# Patient Record
Sex: Female | Born: 2000 | Race: Black or African American | Hispanic: No | Marital: Single | Smoking: Never smoker
Health system: Southern US, Community
[De-identification: ages and names within clinical notes are randomized; demographics above are authoritative.]

## PROBLEM LIST (undated history)

## (undated) DIAGNOSIS — K08409 Partial loss of teeth, unspecified cause, unspecified class: Secondary | ICD-10-CM

## (undated) DIAGNOSIS — Z789 Other specified health status: Secondary | ICD-10-CM

## (undated) DIAGNOSIS — J302 Other seasonal allergic rhinitis: Secondary | ICD-10-CM

## (undated) HISTORY — DX: Other specified health status: Z78.9

## (undated) HISTORY — DX: Other seasonal allergic rhinitis: J30.2

## (undated) HISTORY — PX: NO PAST SURGERIES: SHX2092

---
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
## 2022-01-07 ENCOUNTER — Telehealth: Payer: Medicaid Other | Admitting: Family

## 2022-01-07 ENCOUNTER — Encounter: Payer: Self-pay | Admitting: Family

## 2022-01-07 ENCOUNTER — Telehealth (INDEPENDENT_AMBULATORY_CARE_PROVIDER_SITE_OTHER): Payer: Medicaid Other | Admitting: Family

## 2022-01-07 ENCOUNTER — Other Ambulatory Visit: Payer: Self-pay

## 2022-01-07 DIAGNOSIS — Z975 Presence of (intrauterine) contraceptive device: Secondary | ICD-10-CM

## 2022-01-07 DIAGNOSIS — Z3009 Encounter for other general counseling and advice on contraception: Secondary | ICD-10-CM | POA: Diagnosis not present

## 2022-01-07 DIAGNOSIS — N921 Excessive and frequent menstruation with irregular cycle: Secondary | ICD-10-CM | POA: Diagnosis not present

## 2022-01-07 MED ORDER — CYCLOBENZAPRINE HCL 10 MG PO TABS: ORAL_TABLET | ORAL | 0 refills | Status: DC

## 2022-01-07 NOTE — Progress Notes (Signed)
THIS RECORD MAY CONTAIN CONFIDENTIAL INFORMATION THAT SHOULD NOT BE RELEASED WITHOUT REVIEW OF THE SERVICE PROVIDER.  Virtual Follow-Up Visit via Video Note  I connected with Melanie Carlson   on 01/07/22 at 11:00 AM EST by a video enabled telemedicine application and verified that I am speaking with the correct person using two identifiers.   Patient/parent location: home Provider location: in clinic   I discussed the limitations of evaluation and management by telemedicine and the availability of in person appointments.  I discussed that the purpose of this telehealth visit is to provide medical care while limiting exposure to the novel coronavirus.  The patient expressed understanding and agreed to proceed.   Melanie Carlson is a 21 y.o. female referred by Melanie Deutscher, MD here today for follow-up of breakthrough bleeding with Nexplanon.   History was provided by the patient.  Supervising Physician: Melanie Carlson   Chief Complaint: Breakthrough bleeding with Nexplanon   History of Present Illness:  -had Nexplanon inserted day after delivery  -bled for 6 weeks as expected with pospartum  -will now bleed for 2 months, then will sporadically be off  -always formula fed  -knows that there is hormonal  -LMP 01/19 -concern  No Known Allergies Outpatient Medications Prior to Visit  Medication Sig Dispense Refill   cetirizine (ZYRTEC) 10 MG tablet Take 10 mg by mouth daily.     Fluocinolone Acetonide 0.01 % OIL Instill 5 drops twice daily into both ear(s) for 14 days. Then stop and use as needed for itching or discomfort (Patient not taking: Reported on 05/04/2021)     fluticasone (FLONASE) 50 MCG/ACT nasal spray Place 1 spray into both nostrils 2 (two) times daily. 16 g 2   mirtazapine (REMERON) 15 MG tablet TAKE 1 TABLET BY MOUTH EVERYDAY AT BEDTIME 90 tablet 1   Prenatal Vit-Fe Fumarate-FA (M-NATAL PLUS) 27-1 MG TABS Take 1 tablet by mouth daily. (Patient not taking: No sig  reported)     No facility-administered medications prior to visit.     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  Visual Observations/Objective:  General Appearance: Well nourished well developed, in no apparent distress.  Eyes: conjunctiva no swelling or erythema ENT/Mouth: No hoarseness, No cough for duration of visit.  Neck: Supple  Respiratory: Respiratory effort normal, normal rate, no retractions or distress.   Cardio: Appears well-perfused, noncyanotic Musculoskeletal: no obvious deformity Skin: visible skin without rashes, ecchymosis, erythema Neuro: Awake and oriented X 3,  Psych:  normal affect, Insight and Judgment appropriate.    Assessment/Plan:  Reviewed typical side effects of nexplanon; Melanie Carlson's bleeding pattern is consistent with the implant's most common side effect. Negative gc/c on 12/05. We reviewed the IUD insertion, we discussed the risks and benefits including bleeding, cramping, expulsion, and perforation. [name] elects to return to clinic for insertion.   1. Breakthrough bleeding on Nexplanon 2. Counseling for birth control regarding intrauterine device (IUD)  I discussed the assessment and treatment plan with the patient and/or parent/guardian.  They were provided an opportunity to ask  questions and all were answered.  They agreed with the plan and demonstrated an understanding of the instructions. They were advised to call back or seek an in-person  evaluation in the emergency room if the symptoms worsen or if the condition fails to improve as anticipated.   Follow-up:   Schedule for Nexplanon removal and IUD insertion  Melanie Mouse, NP    CC: Melanie Deutscher, MD, Melanie Deutscher, MD

## 2022-01-08 ENCOUNTER — Ambulatory Visit: Payer: Medicaid Other | Admitting: Family

## 2022-01-08 ENCOUNTER — Encounter: Payer: Self-pay | Admitting: Family

## 2022-01-12 ENCOUNTER — Encounter: Payer: Self-pay | Admitting: Family

## 2022-01-12 ENCOUNTER — Other Ambulatory Visit: Payer: Self-pay

## 2022-01-12 ENCOUNTER — Ambulatory Visit (INDEPENDENT_AMBULATORY_CARE_PROVIDER_SITE_OTHER): Payer: Medicaid Other | Admitting: Family

## 2022-01-12 VITALS — BP 112/69 | HR 74 | Ht 63.0 in | Wt 126.0 lb

## 2022-01-12 DIAGNOSIS — N921 Excessive and frequent menstruation with irregular cycle: Secondary | ICD-10-CM | POA: Diagnosis not present

## 2022-01-12 DIAGNOSIS — Z975 Presence of (intrauterine) contraceptive device: Secondary | ICD-10-CM

## 2022-01-12 DIAGNOSIS — Z3046 Encounter for surveillance of implantable subdermal contraceptive: Secondary | ICD-10-CM

## 2022-01-12 DIAGNOSIS — Z538 Procedure and treatment not carried out for other reasons: Secondary | ICD-10-CM

## 2022-01-12 MED ORDER — ELLA 30 MG PO TABS: 1.0000 | ORAL_TABLET | Freq: Once | ORAL | 0 refills | Status: AC

## 2022-01-12 NOTE — Patient Instructions (Signed)
I sent Emergency Contraception to your pharmacy. Make sure to let me know if you are not able to get an appointment with OB/GYN. I can send a referral for you to see one of our OB/GYN providers.   Your Nexplanon was removed today and is no longer preventing pregnancy.  If you have sex, remember to use condoms to prevent pregnancy and to prevent sexually transmitted infections.  Leave the outside bandage on for 24 hours.  Leave the smaller bandages on for 3-5 days or until they fall off on their own.  Keep the area clean and dry for 3-5 days.  There is usually bruising or swelling at and around the removal site for a few days to a week after the removal.  If you see redness or pus draining from the removal site, call us immediately.  We would like you to return to the clinic for a follow-up visit in 1 month.  You can call [facility] 24 hours a day with any questions or concerns.  There is always a nurse or doctor available to take your call.  Call 9-1-1 if you have a life-threatening emergency.  For anything else, please call us at [phone] before heading to the ER.

## 2022-01-13 ENCOUNTER — Encounter: Payer: Self-pay | Admitting: Family

## 2022-01-13 NOTE — Progress Notes (Signed)
History was provided by the patient.  Melanie Carlson is a 21 y.o. female who is here for Nexplanon removal and IUD insertion.   PCP confirmed? Yes.    [name], MD  HPI:   -presents for Nexplanon removal due to persistent BTB, gc/c negative 12/05 - has been consistently unpredictable since inserted postpartum 01/16/21.  -has been counseled on IUD and desires insertion  -reviewed bleeding, cramping, expulsion, and perforation as risks of procedure; patient aware and consents to proceed.   Patient Active Problem List   Diagnosis Date Noted   Diastasis recti 02/17/2021   Postpartum anxiety 02/03/2021   GAD (generalized anxiety disorder) 04/24/2020   Sleep disturbance 04/24/2020   Major depressive disorder 10/01/2019   Seasonal allergies    Overweight, pediatric, BMI 85.0-94.9 percentile for age 10/30/2018    Current Outpatient Medications on File Prior to Visit  Medication Sig Dispense Refill   cetirizine (ZYRTEC) 10 MG tablet Take 10 mg by mouth daily.     cyclobenzaprine (FLEXERIL) 10 MG tablet Take 1 tablet (10 mg) approximately 4 hours before your scheduled appointment. 2 tablet 0   mirtazapine (REMERON) 15 MG tablet TAKE 1 TABLET BY MOUTH EVERYDAY AT BEDTIME 90 tablet 1   Fluocinolone Acetonide 0.01 % OIL Instill 5 drops twice daily into both ear(s) for 14 days. Then stop and use as needed for itching or discomfort (Patient not taking: Reported on 05/04/2021)     fluticasone (FLONASE) 50 MCG/ACT nasal spray Place 1 spray into both nostrils 2 (two) times daily. 16 g 2   Prenatal Vit-Fe Fumarate-FA (M-NATAL PLUS) 27-1 MG TABS Take 1 tablet by mouth daily. (Patient not taking: No sig reported)     No current facility-administered medications on file prior to visit.    No Known Allergies  Physical Exam:    Vitals:   01/12/22 1020  BP: 112/69  Pulse: 74  Weight: 126 lb (57.2 kg)  Height: 5\' 3"  (1.6 m)    Blood pressure percentiles are not available for patients who  are 18 years or older. No LMP recorded.  Physical Exam Exam conducted with a chaperone present.  Constitutional:      General: She is not in acute distress.    Appearance: She is well-developed.  HENT:     Head: Normocephalic and atraumatic.  Eyes:     General: No scleral icterus.    Pupils: Pupils are equal, round, and reactive to light.  Neck:     Thyroid: No thyromegaly.  Cardiovascular:     Rate and Rhythm: Normal rate.     Heart sounds: No murmur heard. Pulmonary:     Effort: Pulmonary effort is normal.     Breath sounds: Normal breath sounds.  Genitourinary:    General: Normal vulva.     Vagina: Normal.     Cervix: No cervical motion tenderness or friability.  Musculoskeletal:        General: Normal range of motion.     Cervical back: Normal range of motion and neck supple.  Lymphadenopathy:     Cervical: No cervical adenopathy.  Skin:    General: Skin is warm and dry.     Findings: No rash.     Comments: Implant palpable in LUE correct position prior to removal   Neurological:     Mental Status: She is alert and oriented to person, place, and time.     Cranial Nerves: No cranial nerve deficit.  Psychiatric:        Behavior:  Behavior normal.        Thought Content: Thought content normal.        Judgment: Judgment normal.     Assessment/Plan: 1. Breakthrough bleeding on Nexplanon 2. Encounter for Nexplanon removal Risks & benefits of Nexplanon removal discussed. Consent form signed.  The patient denies any allergies to anesthetics or antiseptics.  Procedure: Pt was placed in supine position. left arm was flexed at the elbow and externally rotated so that her wrist was parallel to her ear, The device was palpated and marked. The site was cleaned with Betadine. The area surrounding the device was covered with a sterile drape. 1% lidocaine was injected just under the device. A scalpel was used to create a small incision. The device was pushed towards the  incision. Fibrous tissue surrounding the device was gradually removed from the device. The device was removed and measured to ensure all 4 cm of device was removed. Steri-strips were used to close the incision. Pressure dressing was applied to the patient.  The patient was instructed to removed the pressure dressing in 24 hrs.  The patient was advised to move slowly from a supine to an upright position  The patient denied any concerns or complaints  The patient was instructed to schedule a follow-up appt in 1 month. The patient will be called in 1 week to address any concerns.    3. Unsuccessful IUD insertion Mirena IUD Insertion   The pt presents for Mirena IUD placement.  No contraindications for placement.   The patient took Flexeril 10 mg  prior to appt.   No LMP recorded.  UHCG: NA removed today  Last unprotected sex:  NA, nexplanon removed today   Risks & benefits of IUD discussed  The IUD was purchased and supplied by [facility].  Packaging instructions supplied to patient  Consent form signed.  The patient denies any allergies to anesthetics or antiseptics.   Procedure:  Pt was placed in lithotomy position.  Speculum was inserted.  GC/CT swab was used to collect sample for STI testing.  Tenaculum was used to stabilize the cervix by clasping at 12 o'clock  Betadine was used to clean the cervix and cervical os.  Dilators were used. Cervix was not dilated.  Speculum was removed.  The patient was advised to move slowly from a supine to an upright position  The patient denied any concerns or complaints   Plan to return to OB/GYN in [facility] for IUD; discussed EC - Rx given

## 2022-07-02 ENCOUNTER — Ambulatory Visit (INDEPENDENT_AMBULATORY_CARE_PROVIDER_SITE_OTHER): Payer: Medicaid Other | Admitting: Licensed Clinical Social Worker

## 2022-07-02 ENCOUNTER — Encounter: Payer: Self-pay | Admitting: Pediatrics

## 2022-07-02 ENCOUNTER — Ambulatory Visit (INDEPENDENT_AMBULATORY_CARE_PROVIDER_SITE_OTHER): Payer: Medicaid Other | Admitting: Pediatrics

## 2022-07-02 VITALS — BP 108/66 | HR 60 | Wt 122.0 lb

## 2022-07-02 DIAGNOSIS — Z6820 Body mass index (BMI) 20.0-20.9, adult: Secondary | ICD-10-CM | POA: Diagnosis not present

## 2022-07-02 DIAGNOSIS — R634 Abnormal weight loss: Secondary | ICD-10-CM | POA: Diagnosis not present

## 2022-07-02 DIAGNOSIS — Z1339 Encounter for screening examination for other mental health and behavioral disorders: Secondary | ICD-10-CM

## 2022-07-02 DIAGNOSIS — Z0001 Encounter for general adult medical examination with abnormal findings: Secondary | ICD-10-CM

## 2022-07-02 DIAGNOSIS — F4329 Adjustment disorder with other symptoms: Secondary | ICD-10-CM | POA: Diagnosis not present

## 2022-07-02 DIAGNOSIS — L709 Acne, unspecified: Secondary | ICD-10-CM | POA: Diagnosis not present

## 2022-07-02 DIAGNOSIS — Z1331 Encounter for screening for depression: Secondary | ICD-10-CM

## 2022-07-02 MED ORDER — CLINDAMYCIN PHOS-BENZOYL PEROX 1.2-5 % EX GEL: 1.0000 | Freq: Every day | CUTANEOUS | 1 refills | Status: DC

## 2022-07-02 MED ORDER — TRETINOIN MICROSPHERE 0.04 % EX GEL: Freq: Every day | CUTANEOUS | 1 refills | Status: DC
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
## 2022-07-16 ENCOUNTER — Ambulatory Visit: Payer: Medicaid Other | Admitting: Licensed Clinical Social Worker

## 2022-07-26 ENCOUNTER — Encounter: Payer: Self-pay | Admitting: Pediatrics

## 2022-08-31 ENCOUNTER — Other Ambulatory Visit: Payer: Self-pay | Admitting: Pediatrics

## 2022-08-31 DIAGNOSIS — L709 Acne, unspecified: Secondary | ICD-10-CM

## 2022-09-07 ENCOUNTER — Ambulatory Visit
Admission: RE | Admit: 2022-09-07 | Discharge: 2022-09-07 | Disposition: A | Discharge status: Home / Self Care | Payer: Medicaid Other | Source: Ambulatory Visit | Attending: Urgent Care | Admitting: Urgent Care

## 2022-09-07 VITALS — BP 120/69 | HR 76 | Temp 98.1°F | Resp 17

## 2022-09-07 DIAGNOSIS — B3731 Acute candidiasis of vulva and vagina: Secondary | ICD-10-CM | POA: Diagnosis not present

## 2022-09-07 MED ORDER — FLUCONAZOLE 150 MG PO TABS: 150.0000 mg | ORAL_TABLET | Freq: Once | ORAL | 0 refills | Status: AC

## 2022-09-07 NOTE — ED Provider Notes (Signed)
[facility]  Note:  This document was prepared using Systems analyst and may include unintentional dictation errors.  MRN: [id number] DOB: 2001/07/22  Subjective:   Melanie Carlson is a 21 y.o. female presenting for 1 day history of vaginal itching and irritation. She took Monistat and helped. However, she would like to be checked for reassurance. Would like an STI check as well. Denies fever, n/v, abdominal pain, pelvic pain, rashes, dysuria, urinary frequency, hematuria, vaginal discharge.  No concerns for pregnancy at this time. LMP was 08/16/2022.  No current facility-administered medications for this encounter.  Current Outpatient Medications:    cetirizine (ZYRTEC) 10 MG tablet, Take 10 mg by mouth daily., Disp: , Rfl:    Clindamycin-Benzoyl Per, Refr, gel, APPLY 1 APPLICATION TOPICALLY DAILY. FOLLOW WITH NON-COMEDOGENIC MOISTURIZER, Disp: 45 g, Rfl: 1   cyclobenzaprine (FLEXERIL) 10 MG tablet, Take 1 tablet (10 mg) approximately 4 hours before your scheduled appointment. (Patient not taking: Reported on 07/02/2022), Disp: 2 tablet, Rfl: 0   fluticasone (FLONASE) 50 MCG/ACT nasal spray, Place 1 spray into both nostrils 2 (two) times daily., Disp: 16 g, Rfl: 2   mirtazapine (REMERON) 15 MG tablet, TAKE 1 TABLET BY MOUTH EVERYDAY AT BEDTIME, Disp: 90 tablet, Rfl: 1   tretinoin microspheres (RETIN-A MICRO) 0.04 % gel, Apply topically at bedtime. Apply at bedtime - start with 2x/week and gradually increase to daily as tolerated. SPF daily., Disp: 45 g, Rfl: 1   No Known Allergies  Past Medical History:  Diagnosis Date   Medical history non-contributory    Seasonal allergies    Seasonal allergies    Wisdom teeth extracted      Past Surgical History:  Procedure Laterality Date   NO PAST SURGERIES      No family history on file.  Social History   Tobacco Use   Smoking status: Never    Passive exposure: Never   Smokeless tobacco: Never   Vaping Use   Vaping Use: Never used  Substance Use Topics   Alcohol use: Not Currently   Drug use: Yes    Types: Marijuana    ROS   Objective:   Vitals: BP 120/69   Pulse 76   Temp 98.1 F (36.7 C)   Resp 17   LMP 08/16/2022   SpO2 97%   Physical Exam Constitutional:      General: She is not in acute distress.    Appearance: Normal appearance. She is well-developed. She is not ill-appearing, toxic-appearing or diaphoretic.  HENT:     Head: Normocephalic and atraumatic.     Nose: Nose normal.     Mouth/Throat:     Mouth: Mucous membranes are moist.  Eyes:     General: No scleral icterus.       Right eye: No discharge.        Left eye: No discharge.     Extraocular Movements: Extraocular movements intact.  Cardiovascular:     Rate and Rhythm: Normal rate.  Pulmonary:     Effort: Pulmonary effort is normal.  Skin:    General: Skin is warm and dry.  Neurological:     General: No focal deficit present.     Mental Status: She is alert and oriented to person, place, and time.  Psychiatric:        Mood and Affect: Mood normal.        Behavior: Behavior normal.     Assessment and Plan :   PDMP  not reviewed this encounter.  1. Yeast vaginitis    Will cover patient with 1 dose of fluconazole. Treat as appropriate based off her vaginal swab results otherwise. Counseled patient on potential for adverse effects with medications prescribed/recommended today, ER and return-to-clinic precautions discussed, patient verbalized understanding.    Wallis Bamberg, [place] 09/07/22 [phone]

## 2022-09-07 NOTE — Discharge Instructions (Addendum)
Will let you know about your test results and if you need further treatment.

## 2022-09-07 NOTE — ED Triage Notes (Signed)
Pt had symptoms of a yeast infection and took one day monistat. She is not having symptoms anymore but would like a test done to make sure all is cleared up.

## 2022-09-08 LAB — CERVICOVAGINAL ANCILLARY ONLY
Bacterial Vaginitis (gardnerella): POSITIVE — AB
Candida Glabrata: NEGATIVE
Candida Vaginitis: NEGATIVE
Chlamydia: NEGATIVE
Comment: NEGATIVE
Comment: NEGATIVE
Comment: NEGATIVE
Comment: NEGATIVE
Comment: NEGATIVE
Comment: NORMAL
Neisseria Gonorrhea: NEGATIVE
Trichomonas: NEGATIVE

## 2022-09-09 ENCOUNTER — Telehealth (HOSPITAL_COMMUNITY): Payer: Self-pay | Admitting: Emergency Medicine

## 2022-09-09 MED ORDER — METRONIDAZOLE 500 MG PO TABS: 500.0000 mg | ORAL_TABLET | Freq: Two times a day (BID) | ORAL | 0 refills | Status: DC

## 2022-09-22 ENCOUNTER — Ambulatory Visit: Payer: Medicaid Other | Admitting: Family Medicine

## 2022-09-25 ENCOUNTER — Ambulatory Visit: Payer: Medicaid Other

## 2022-10-04 ENCOUNTER — Ambulatory Visit: Payer: Medicaid Other

## 2022-10-05 ENCOUNTER — Ambulatory Visit
Admission: RE | Admit: 2022-10-05 | Discharge: 2022-10-05 | Disposition: A | Discharge status: Home / Self Care | Payer: Medicaid Other | Source: Ambulatory Visit | Attending: Urgent Care | Admitting: Urgent Care

## 2022-10-05 VITALS — BP 112/66 | HR 77 | Temp 98.4°F | Resp 16

## 2022-10-05 DIAGNOSIS — Z113 Encounter for screening for infections with a predominantly sexual mode of transmission: Secondary | ICD-10-CM | POA: Diagnosis not present

## 2022-10-05 NOTE — ED Triage Notes (Signed)
Patient presents to UC for STD testing. Asymptomatic.  

## 2022-10-05 NOTE — ED Provider Notes (Signed)
[facility]  Note:  This document was prepared using Conservation officer, historic buildings and may include unintentional dictation errors.  MRN: [id number] DOB: 07-27-2001  Subjective:   Melanie Carlson is a 21 y.o. female presenting for STI testing.  Patient had sex with a new partner, uses condoms.  Would like to have blood work done as well. Denies fever, n/v, abdominal pain, pelvic pain, rashes, dysuria, urinary frequency, hematuria, vaginal discharge.    No current facility-administered medications for this encounter.  Current Outpatient Medications:    cetirizine (ZYRTEC) 10 MG tablet, Take 10 mg by mouth daily., Disp: , Rfl:    Clindamycin-Benzoyl Per, Refr, gel, APPLY 1 APPLICATION TOPICALLY DAILY. FOLLOW WITH NON-COMEDOGENIC MOISTURIZER, Disp: 45 g, Rfl: 1   cyclobenzaprine (FLEXERIL) 10 MG tablet, Take 1 tablet (10 mg) approximately 4 hours before your scheduled appointment. (Patient not taking: Reported on 07/02/2022), Disp: 2 tablet, Rfl: 0   fluticasone (FLONASE) 50 MCG/ACT nasal spray, Place 1 spray into both nostrils 2 (two) times daily., Disp: 16 g, Rfl: 2   metroNIDAZOLE (FLAGYL) 500 MG tablet, Take 1 tablet (500 mg total) by mouth 2 (two) times daily., Disp: 14 tablet, Rfl: 0   mirtazapine (REMERON) 15 MG tablet, TAKE 1 TABLET BY MOUTH EVERYDAY AT BEDTIME, Disp: 90 tablet, Rfl: 1   tretinoin microspheres (RETIN-A MICRO) 0.04 % gel, Apply topically at bedtime. Apply at bedtime - start with 2x/week and gradually increase to daily as tolerated. SPF daily., Disp: 45 g, Rfl: 1   No Known Allergies  Past Medical History:  Diagnosis Date   Medical history non-contributory    Seasonal allergies    Seasonal allergies    Wisdom teeth extracted      Past Surgical History:  Procedure Laterality Date   NO PAST SURGERIES      Family History  Problem Relation Age of Onset   Healthy Mother    Healthy Father     Social History   Tobacco Use   Smoking  status: Never    Passive exposure: Never   Smokeless tobacco: Never  Vaping Use   Vaping Use: Never used  Substance Use Topics   Alcohol use: Not Currently   Drug use: Yes    Types: Marijuana    ROS   Objective:   Vitals: BP 112/66 (BP Location: Left Arm)   Pulse 77   Temp 98.4 F (36.9 C) (Oral)   Resp 16   LMP 09/17/2022 (Approximate)   SpO2 97%   Breastfeeding No   Physical Exam Constitutional:      General: She is not in acute distress.    Appearance: Normal appearance. She is well-developed. She is not ill-appearing, toxic-appearing or diaphoretic.  HENT:     Head: Normocephalic and atraumatic.     Nose: Nose normal.     Mouth/Throat:     Mouth: Mucous membranes are moist.  Eyes:     General: No scleral icterus.       Right eye: No discharge.        Left eye: No discharge.     Extraocular Movements: Extraocular movements intact.  Cardiovascular:     Rate and Rhythm: Normal rate.  Pulmonary:     Effort: Pulmonary effort is normal.  Skin:    General: Skin is warm and dry.  Neurological:     General: No focal deficit present.     Mental Status: She is alert and oriented to person, place, and time.  Psychiatric:  Mood and Affect: Mood normal.        Behavior: Behavior normal.      Assessment and Plan :   PDMP not reviewed this encounter.  1. Screen for STD (sexually transmitted disease)     Deferred testing for bacterial vaginosis and yeast infection given lack of symptoms.  STI testing pending.  We will treat as appropriate.  Patient declined AVS as she has MyChart.   Jaynee Eagles, PA-C 10/05/22 1001

## 2022-10-06 LAB — CERVICOVAGINAL ANCILLARY ONLY
Chlamydia: NEGATIVE
Comment: NEGATIVE
Comment: NEGATIVE
Comment: NORMAL
Neisseria Gonorrhea: NEGATIVE
Trichomonas: NEGATIVE

## 2022-10-06 LAB — RPR: RPR Ser Ql: NONREACTIVE

## 2022-10-06 LAB — HIV ANTIBODY (ROUTINE TESTING W REFLEX): HIV Screen 4th Generation wRfx: NONREACTIVE

## 2022-10-13 ENCOUNTER — Ambulatory Visit: Payer: Medicaid Other | Admitting: Family Medicine

## 2022-10-18 ENCOUNTER — Ambulatory Visit: Payer: Medicaid Other | Admitting: Pediatrics

## 2023-02-12 DIAGNOSIS — R1013 Epigastric pain: Secondary | ICD-10-CM | POA: Diagnosis not present

## 2023-02-12 DIAGNOSIS — Z20822 Contact with and (suspected) exposure to covid-19: Secondary | ICD-10-CM | POA: Diagnosis not present

## 2023-02-13 ENCOUNTER — Telehealth: Payer: Medicaid Other | Admitting: Physician Assistant

## 2023-02-13 DIAGNOSIS — R112 Nausea with vomiting, unspecified: Secondary | ICD-10-CM

## 2023-02-14 MED ORDER — PROMETHAZINE HCL 25 MG PO TABS: 25.0000 mg | ORAL_TABLET | Freq: Three times a day (TID) | ORAL | 0 refills | Status: DC | PRN

## 2023-02-14 NOTE — Addendum Note (Signed)
Addended by: Mar Daring on: 02/14/2023 07:56 AM   Modules accepted: Orders

## 2023-02-14 NOTE — Progress Notes (Signed)
E-Visit for Nausea and Vomiting   We are sorry that you are not feeling well. Here is how we plan to help!  Based on what you have shared with me it looks like you have a Virus that is irritating your GI tract.  Vomiting is the forceful emptying of a portion of the stomach's content through the mouth.  Although nausea and vomiting can make you feel miserable, it's important to remember that these are not diseases, but rather symptoms of an underlying illness.  When we treat short term symptoms, we always caution that any symptoms that persist should be fully evaluated in a medical office.  I have prescribed a medication that will help alleviate your symptoms and allow you to stay hydrated:  Promethazine 25 mg take 1 tablet twice daily  HOME CARE: Drink clear liquids.  This is very important! Dehydration (the lack of fluid) can lead to a serious complication.  Start off with 1 tablespoon every 5 minutes for 8 hours. You may begin eating bland foods after 8 hours without vomiting.  Start with saltine crackers, white bread, rice, mashed potatoes, applesauce. After 48 hours on a bland diet, you may resume a normal diet. Try to go to sleep.  Sleep often empties the stomach and relieves the need to vomit.  GET HELP RIGHT AWAY IF:  Your symptoms do not improve or worsen within 2 days after treatment. You have a fever for over 3 days. You cannot keep down fluids after trying the medication.  MAKE SURE YOU:  Understand these instructions. Will watch your condition. Will get help right away if you are not doing well or get worse.    Thank you for choosing an e-visit.  Your e-visit answers were reviewed by a board certified advanced clinical practitioner to complete your personal care plan. Depending upon the condition, your plan could have included both over the counter or prescription medications.  Please review your pharmacy choice. Make sure the pharmacy is open so you can pick up  prescription now. If there is a problem, you may contact your provider through [employer] and have the prescription routed to another pharmacy.  Your safety is important to [place]. If you have drug allergies check your prescription carefully.   For the next 24 hours you can use MyChart to ask questions about today's visit, request a non-urgent call back, or ask for a work or school excuse. You will get an email in the next two days asking about your experience. I hope that your e-visit has been valuable and will speed your recovery.  I have spent 5 minutes in review of e-visit questionnaire, review and updating patient chart, medical decision making and response to patient.   Mar Daring, PA-C

## 2023-02-14 NOTE — Progress Notes (Signed)
Because first-line treatments are not helping, I feel your condition warrants further evaluation and I recommend that you be seen in a face to face visit. You may require IV fluids for dehydration and medications to be given in injectable form for better control of nausea since you have failed oral medications.   NOTE: There will be NO CHARGE for this eVisit   If you are having a true medical emergency please call 911.      For an urgent face to face visit, [place] has eight urgent care centers for your convenience:   NEW!! [facility] [place] Get Driving Directions [id number] [address] [place], [place] [facility] [place] [facility] [place] Get Driving Directions [id number] [place], [place] [address]   [place] [facility] [place] [facility]) Get Driving Directions [id number] [address], [place] [address]  [place] [facility] [place] ([place]) Get Driving Directions [id number] [address] [place],  [place]  [address]  [place] [facility] [place] [facility] - at [facility] Get Driving Directions  [phone] [address] [place],  [place] [address]   [facility] Get Driving Directions [id number] [place], [place], [place] [address]   [facility] Get Driving Directions  [id number] [address] [place], [place] [address]   [facility] [place] Get Driving Directions [id number] [address] [place], [place] [address]  Your MyChart E-visit questionnaire answers were reviewed by a board certified advanced clinical practitioner to complete your personal care plan based on your specific symptoms.  Thank you for using e-Visits.   I have spent 5 minutes in review of  e-visit questionnaire, review and updating patient chart, medical decision making and response to patient.   Mar Daring, PA-C

## 2023-04-07 DIAGNOSIS — S46811A Strain of other muscles, fascia and tendons at shoulder and upper arm level, right arm, initial encounter: Secondary | ICD-10-CM | POA: Diagnosis not present

## 2023-04-07 DIAGNOSIS — S46812A Strain of other muscles, fascia and tendons at shoulder and upper arm level, left arm, initial encounter: Secondary | ICD-10-CM | POA: Diagnosis not present

## 2023-04-07 DIAGNOSIS — S0990XA Unspecified injury of head, initial encounter: Secondary | ICD-10-CM | POA: Diagnosis not present

## 2023-06-15 ENCOUNTER — Telehealth: Payer: Self-pay

## 2023-06-15 NOTE — Telephone Encounter (Signed)
Past due for [facility] and PCP follow up. Please call and schedule. Also include in appointment note to discuss adolescent transition process due to age.

## 2023-06-22 ENCOUNTER — Telehealth: Payer: Self-pay | Admitting: Pediatrics

## 2023-06-22 NOTE — Telephone Encounter (Signed)
Reached put to pt. Lvm in regards to scheduling [facility] with PCP, please include in notes to discuss adolescent transition process due to age when making appointment.

## 2023-07-25 ENCOUNTER — Ambulatory Visit: Payer: Self-pay

## 2023-07-26 ENCOUNTER — Ambulatory Visit
Admission: RE | Admit: 2023-07-26 | Discharge: 2023-07-26 | Disposition: A | Discharge status: Home / Self Care | Payer: Medicaid Other | Source: Ambulatory Visit | Attending: Internal Medicine | Admitting: Internal Medicine

## 2023-07-26 VITALS — BP 122/77 | HR 78 | Temp 100.0°F | Resp 16

## 2023-07-26 DIAGNOSIS — H65196 Other acute nonsuppurative otitis media, recurrent, bilateral: Secondary | ICD-10-CM

## 2023-07-26 DIAGNOSIS — J309 Allergic rhinitis, unspecified: Secondary | ICD-10-CM | POA: Diagnosis not present

## 2023-07-26 DIAGNOSIS — H6993 Unspecified Eustachian tube disorder, bilateral: Secondary | ICD-10-CM | POA: Diagnosis not present

## 2023-07-26 MED ORDER — PSEUDOEPHEDRINE HCL 60 MG PO TABS: 60.0000 mg | ORAL_TABLET | Freq: Three times a day (TID) | ORAL | 0 refills | Status: DC | PRN

## 2023-07-26 MED ORDER — CETIRIZINE HCL 10 MG PO TABS: 10.0000 mg | ORAL_TABLET | Freq: Every day | ORAL | 0 refills | Status: DC

## 2023-07-26 MED ORDER — FLUTICASONE PROPIONATE 50 MCG/ACT NA SUSP: 2.0000 | Freq: Every day | NASAL | 12 refills | Status: DC

## 2023-07-26 MED ORDER — AMOXICILLIN 875 MG PO TABS: 875.0000 mg | ORAL_TABLET | Freq: Two times a day (BID) | ORAL | 0 refills | Status: DC

## 2023-07-26 NOTE — ED Provider Notes (Signed)
[facility] - URGENT CARE CENTER  Note:  This document was prepared using Conservation officer, historic buildings and may include unintentional dictation errors.  MRN: [id number] DOB: 2001/06/09  Subjective:   Melanie Carlson is a 22 y.o. female presenting for 1 week history of recurrent persistent and worsening bilateral ear fullness, pain worse to the right.  Patient has a history of allergic rhinitis, does not take anything consistently for this.  She does smoke marijuana on the weekends.  Since her last pregnancy she has had intermittent recurrent ear infections.  Has not had to see anyone for this.  Usually resolves with antibiotic use.   No current facility-administered medications for this encounter.  Current Outpatient Medications:    cetirizine (ZYRTEC) 10 MG tablet, Take 10 mg by mouth daily., Disp: , Rfl:    Clindamycin-Benzoyl Per, Refr, gel, APPLY 1 APPLICATION TOPICALLY DAILY. FOLLOW WITH NON-COMEDOGENIC MOISTURIZER, Disp: 45 g, Rfl: 1   cyclobenzaprine (FLEXERIL) 10 MG tablet, Take 1 tablet (10 mg) approximately 4 hours before your scheduled appointment. (Patient not taking: Reported on 07/02/2022), Disp: 2 tablet, Rfl: 0   fluticasone (FLONASE) 50 MCG/ACT nasal spray, Place 1 spray into both nostrils 2 (two) times daily., Disp: 16 g, Rfl: 2   metroNIDAZOLE (FLAGYL) 500 MG tablet, Take 1 tablet (500 mg total) by mouth 2 (two) times daily., Disp: 14 tablet, Rfl: 0   mirtazapine (REMERON) 15 MG tablet, TAKE 1 TABLET BY MOUTH EVERYDAY AT BEDTIME, Disp: 90 tablet, Rfl: 1   promethazine (PHENERGAN) 25 MG tablet, Take 1 tablet (25 mg total) by mouth every 8 (eight) hours as needed for nausea or vomiting., Disp: 20 tablet, Rfl: 0   tretinoin microspheres (RETIN-A MICRO) 0.04 % gel, Apply topically at bedtime. Apply at bedtime - start with 2x/week and gradually increase to daily as tolerated. SPF daily., Disp: 45 g, Rfl: 1   No Known Allergies  Past Medical History:  Diagnosis Date    Medical history non-contributory    Seasonal allergies    Seasonal allergies    Wisdom teeth extracted      Past Surgical History:  Procedure Laterality Date   NO PAST SURGERIES      Family History  Problem Relation Age of Onset   Healthy Mother    Healthy Father     Social History   Tobacco Use   Smoking status: Never    Passive exposure: Never   Smokeless tobacco: Never  Vaping Use   Vaping status: Never Used  Substance Use Topics   Alcohol use: Not Currently   Drug use: Yes    Frequency: 3.0 times per week    Types: Marijuana    ROS   Objective:   Vitals: BP 122/77 (BP Location: Right Arm)   Pulse 78   Temp 100 F (37.8 C) (Oral)   Resp 16   LMP 07/25/2023 (Exact Date)   SpO2 98%   Physical Exam Constitutional:      General: She is not in acute distress.    Appearance: Normal appearance. She is well-developed. She is not ill-appearing, toxic-appearing or diaphoretic.  HENT:     Head: Normocephalic and atraumatic.     Right Ear: Ear canal and external ear normal. No tenderness. A middle ear effusion is present. There is no impacted cerumen. Tympanic membrane is erythematous. Tympanic membrane is not injected, perforated or bulging.     Left Ear: Ear canal and external ear normal. No tenderness. A middle ear effusion is present. There is  no impacted cerumen. Tympanic membrane is not injected, perforated, erythematous or bulging.     Nose: Nose normal.     Mouth/Throat:     Mouth: Mucous membranes are moist.  Eyes:     General: No scleral icterus.       Right eye: No discharge.        Left eye: No discharge.     Extraocular Movements: Extraocular movements intact.  Cardiovascular:     Rate and Rhythm: Normal rate.  Pulmonary:     Effort: Pulmonary effort is normal.  Skin:    General: Skin is warm and dry.  Neurological:     General: No focal deficit present.     Mental Status: She is alert and oriented to person, place, and time.  Psychiatric:         Mood and Affect: Mood normal.        Behavior: Behavior normal.     Assessment and Plan :   PDMP not reviewed this encounter.  1. Other recurrent acute nonsuppurative otitis media of both ears   2. Eustachian tube dysfunction, bilateral   3. Allergic rhinitis, unspecified seasonality, unspecified trigger    Will cover for recurrent otitis media of both ears with amoxicillin.  Suspect underlying untreated allergic rhinitis and eustachian tube dysfunction.  Will have patient's start Flonase, Zyrtec daily, use pseudoephedrine as needed.  Counseled patient on potential for adverse effects with medications prescribed/recommended today, ER and return-to-clinic precautions discussed, patient verbalized understanding.    Wallis Bamberg, PA-C 07/26/23 1020

## 2023-07-26 NOTE — Discharge Instructions (Signed)
For the infection of the middle, finish amoxicillin for 10 days. To prevent the inflammation and irritation that leads to ear infections, take Flonase and Zyrtec daily, long term. For the rest of this week, take pseudoephedrine, for fluid build up in the middle ears and sinuses. Thereafter, use it only as needed.

## 2023-07-26 NOTE — ED Triage Notes (Signed)
Pt reports bilateral ear fullness and pain x 1 week. Reports burning sensation in ear when she used the ear wax removal drops.

## 2023-08-23 ENCOUNTER — Encounter: Payer: Self-pay | Admitting: Pediatrics

## 2023-10-05 ENCOUNTER — Ambulatory Visit: Payer: 59

## 2023-10-07 ENCOUNTER — Ambulatory Visit: Payer: 59

## 2023-11-07 ENCOUNTER — Encounter: Payer: Self-pay | Admitting: Pediatrics

## 2023-11-07 ENCOUNTER — Ambulatory Visit (INDEPENDENT_AMBULATORY_CARE_PROVIDER_SITE_OTHER): Payer: 59 | Admitting: Pediatrics

## 2023-11-07 ENCOUNTER — Other Ambulatory Visit (HOSPITAL_COMMUNITY)
Admission: RE | Admit: 2023-11-07 | Discharge: 2023-11-07 | Disposition: A | Discharge status: Home / Self Care | Payer: 59 | Source: Ambulatory Visit | Attending: Pediatrics | Admitting: Pediatrics

## 2023-11-07 VITALS — BP 110/76 | HR 89 | Ht 63.5 in | Wt 131.8 lb

## 2023-11-07 DIAGNOSIS — Z0000 Encounter for general adult medical examination without abnormal findings: Secondary | ICD-10-CM | POA: Diagnosis not present

## 2023-11-07 DIAGNOSIS — Z130 Encounter for screening for diseases of the blood and blood-forming organs and certain disorders involving the immune mechanism: Secondary | ICD-10-CM

## 2023-11-07 DIAGNOSIS — Z1339 Encounter for screening examination for other mental health and behavioral disorders: Secondary | ICD-10-CM | POA: Diagnosis not present

## 2023-11-07 DIAGNOSIS — Z113 Encounter for screening for infections with a predominantly sexual mode of transmission: Secondary | ICD-10-CM | POA: Insufficient documentation

## 2023-11-07 DIAGNOSIS — Z1331 Encounter for screening for depression: Secondary | ICD-10-CM

## 2023-11-07 DIAGNOSIS — Z00121 Encounter for routine child health examination with abnormal findings: Secondary | ICD-10-CM

## 2023-11-07 DIAGNOSIS — Z114 Encounter for screening for human immunodeficiency virus [HIV]: Secondary | ICD-10-CM | POA: Diagnosis not present

## 2023-11-07 LAB — POCT RAPID HIV: Rapid HIV, POC: NEGATIVE

## 2023-11-07 MED ORDER — MIRTAZAPINE 15 MG PO TABS: 15.0000 mg | ORAL_TABLET | Freq: Every day | ORAL | 3 refills | Status: AC

## 2023-11-07 NOTE — Progress Notes (Signed)
Adolescent Well Care Visit Melanie Carlson is a 22 y.o. female who is here for well care.     PCP:  [name], MD   History was provided by the patient.  Confidentiality was discussed with the patient and, if applicable, with caregiver. Personal cell phone [phone]   Current Issues: Current concerns include  Hard on herself. 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 Tried to get a job multiple times but without success. Grandma and grandpa help with her 3yo daughter. Has a bf (does stay in touch with her daughter's father but he's not super reliable); new bf is good. On IUD. Likes it. Mood has been low. Would like to restart her Remeron. Felt like the SSRI did not help but remeron did.   Nutrition: Nutrition/Eating Behaviors: wide variety, stress starves when overwhelmed. Feels content with her weight Adequate calcium in diet?: yes  Exercise/ Media: Play any Sports?:  none  Sleep:  Sleep: 8-10 hours  Social Screening: Lives with:  grandma, grandpa and her daughter (3) Parental relations:  good Activities, Work, and Regulatory affairs officer?: trying to get new job, in 2 different schools currently Concerns regarding behavior with peers?  no  Education: School Grade: n/a Menstruation:   Patient's last menstrual period was 10/20/2023. Menstrual History: normal, regular, no concerns   Patient has a dental home: yes   Confidential social history: Tobacco?  no Secondhand smoke exposure? no Drugs/ETOH?  no  Sexually Active?  yes   Pregnancy Prevention: IUD  Safe at home, in school & in relationships? yes Safe to self?  Yes   Screenings:  The patient completed the Rapid Assessment for Adolescent Preventive Services screening questionnaire and the following topics were identified as risk factors and discussed: healthy eating, drug use, birth control, suicidality/self harm, and mental health issues  In  addition, the following topics were discussed as part of anticipatory guidance: pregnancy prevention, depression/anxiety.  PHQ-9 completed and results indicated ELEVATED. Will restart remeron. Follow-up in 1 month.     11/07/2023    8:34 AM 07/02/2022    3:48 PM 03/31/2021    2:08 PM  PHQ9 SCORE ONLY  PHQ-9 Total Score 6 11 2     Flowsheet Row Office Visit from 11/07/2023 in [place] [facility]  PHQ-2 Total Score 2        Physical Exam:  Vitals:   11/07/23 0830  BP: 110/76  Pulse: 89  SpO2: 98%  Weight: 131 lb 12.8 oz (59.8 kg)  Height: 5' 3.5" (1.613 m)   BP 110/76 (BP Location: Right Arm, Patient Position: Sitting, Cuff Size: Normal)   Pulse 89   Ht 5' 3.5" (1.613 m)   Wt 131 lb 12.8 oz (59.8 kg)   LMP 10/20/2023   SpO2 98%   BMI 22.98 kg/m  Body mass index: body mass index is 22.98 kg/m. Growth %ile SmartLinks can only be used for patients less than 67 years old.  Hearing Screening  Method: Audiometry   500Hz  1000Hz  2000Hz  4000Hz   Right ear 20 20 20 20   Left ear 20 20 20 20    Vision Screening   Right eye Left eye Both eyes  Without correction 20/20 20/20 20/20   With correction       General: well developed, no acute distress, gait normal HEENT: PERRL, normal oropharynx, TMs normal bilaterally Neck: supple, no lymphadenopathy CV: RRR no murmur noted PULM: normal  aeration throughout all lung fields, no crackles or wheezes Abdomen: soft, non-tender; no masses or HSM Extremities: warm and well perfused Gu: deferred Skin: no rash Neuro: alert and oriented, moves all extremities equally   Assessment and Plan:  Melanie Carlson is a 22 y.o. female who is here for well care.   #Well teen: -BMI is appropriate for age -Discussed anticipatory guidance including pregnancy/STI prevention, alcohol/drug use, safety in the car and around water -Screens: Hearing screening result:normal; Vision screening result:  normal  #Sexually active, on birth control: - iud. Will do G/C testing. Personal cell phone above in note.   #Decreased mood, combination of anxiety/depression: - restart remeron 15mg  daily. F/u in 37mo to discuss how mood is.     No follow-ups on file.[name], MD

## 2023-11-08 LAB — URINE CYTOLOGY ANCILLARY ONLY
Chlamydia: NEGATIVE
Comment: NEGATIVE
Comment: NORMAL
Neisseria Gonorrhea: NEGATIVE

## 2023-11-25 ENCOUNTER — Ambulatory Visit
Admission: EM | Admit: 2023-11-25 | Discharge: 2023-11-25 | Disposition: A | Discharge status: Home / Self Care | Payer: 59 | Attending: Internal Medicine | Admitting: Internal Medicine

## 2023-11-25 ENCOUNTER — Encounter: Payer: Self-pay | Admitting: Emergency Medicine

## 2023-11-25 ENCOUNTER — Other Ambulatory Visit: Payer: Self-pay

## 2023-11-25 DIAGNOSIS — H00012 Hordeolum externum right lower eyelid: Secondary | ICD-10-CM | POA: Diagnosis not present

## 2023-11-25 MED ORDER — ERYTHROMYCIN 5 MG/GM OP OINT: TOPICAL_OINTMENT | OPHTHALMIC | 0 refills | Status: DC

## 2023-11-25 NOTE — ED Triage Notes (Signed)
Patient presents to [facility] for evaluation of right eye swelling x 2 days.  Improved some with a warm shower, she restarted remeron and changed her eyeliner brand.

## 2023-11-25 NOTE — ED Provider Notes (Signed)
[facility]    CSN: [id number] Arrival date & time: 11/25/23  1308      History   Chief Complaint Chief Complaint  Patient presents with   Facial Swelling    HPI Melanie Carlson is a 22 y.o. female presents for eyelid swelling.  Patient reports 2 days of swelling to the right inner lower eyelid.  She states it started after using a new eyeliner.  She also reports she restarted her antidepressant that she has been on in the past.  Denies any injury to the eye, visual changes, eye drainage, eye redness, facial swelling.  She states he got somewhat better with warm compresses.  No other concerns at this time.  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 Authorizing Provider  cetirizine (ZYRTEC ALLERGY) 10 MG tablet Take 1 tablet (10 mg total) by mouth daily. 07/26/23   Wallis Bamberg, PA-C  erythromycin ophthalmic ointment Place a 1/2 inch ribbon of ointment into the right lower eyelid twice daily for 3 days 11/25/23   Radford Pax, NP  fluticasone [facility]) 50 MCG/ACT nasal spray Place 2 sprays into both nostrils daily. 07/26/23   Wallis Bamberg, PA-C  mirtazapine (REMERON) 15 MG tablet  Take 1 tablet (15 mg total) by mouth at bedtime. 11/07/23 11/06/24  [name], MD    Family History Family History  Problem Relation Age of Onset   Healthy Mother    Healthy Father     Social History Social History   Tobacco Use   Smoking status: Never    Passive exposure: Never   Smokeless tobacco: Never  Vaping Use   Vaping status: Never Used  Substance Use Topics   Alcohol use: Not Currently   Drug use: Yes    Frequency: 3.0 times per week    Types: Marijuana     Allergies   Patient has no known allergies.   Review of Systems Review of Systems  Eyes:        Lower eyelid swelling     Physical Exam Triage Vital Signs ED Triage Vitals  Encounter Vitals Group     BP 11/25/23 1906 113/82     Systolic BP Percentile --      Diastolic BP Percentile --      Pulse Rate 11/25/23 1906 98     Resp 11/25/23 1906 18  Temp 11/25/23 1906 98.6 F (37 C)     Temp Source 11/25/23 1906 Oral     SpO2 11/25/23 1906 97 %     Weight --      Height --      Head Circumference --      Peak Flow --      Pain Score 11/25/23 1904 1     Pain Loc --      Pain Education --      Exclude from Growth Chart --    No data found.  Updated Vital Signs BP 113/82 (BP Location: Right Arm)   Pulse 98   Temp 98.6 F (37 C) (Oral)   Resp 18   LMP 10/20/2023   SpO2 97%   Visual Acuity Right Eye Distance:   Left Eye Distance:   Bilateral Distance:    Right Eye Near:   Left Eye Near:    Bilateral Near:     Physical Exam Vitals and nursing note reviewed.  Constitutional:      General: She is not in acute distress.    Appearance: Normal appearance. She is not ill-appearing.  HENT:     Head: Normocephalic and atraumatic.  Eyes:     General:        Right eye: Hordeolum present. No foreign body or discharge.     Conjunctiva/sclera:     Right eye: Right conjunctiva is not injected. No chemosis, exudate or hemorrhage.    Pupils: Pupils are equal, round, and reactive to  light.   Cardiovascular:     Rate and Rhythm: Normal rate.  Pulmonary:     Effort: Pulmonary effort is normal.  Skin:    General: Skin is warm and dry.  Neurological:     General: No focal deficit present.     Mental Status: She is alert and oriented to person, place, and time.  Psychiatric:        Mood and Affect: Mood normal.        Behavior: Behavior normal.      UC Treatments / Results  Labs (all labs ordered are listed, but only abnormal results are displayed) Labs Reviewed - No data to display  EKG   Radiology No results found.  Procedures Procedures (including critical care time)  Medications Ordered in UC Medications - No data to display  Initial Impression / Assessment and Plan / UC Course  I have reviewed the triage vital signs and the nursing notes.  Pertinent labs & imaging results that were available during my care of the patient were reviewed by me and considered in my medical decision making (see chart for details).     Reviewed exam and symptoms with patient.  No red flags.  Erythromycin ointment x 3 days.  Continue warm compresses.  PCP follow-up as symptoms do not improve.  ER precautions reviewed. Final Clinical Impressions(s) / UC Diagnoses   Final diagnoses:  Hordeolum externum of right lower eyelid     Discharge Instructions      Erythromycin ointment as prescribed.  Continue warm compresses.  Follow-up with your PCP if your symptoms do not improve.  Please go to the ER for any worsening symptoms.  I hope you feel better soon!    ED Prescriptions     Medication Sig Dispense Auth. Provider   erythromycin ophthalmic ointment Place a 1/2 inch ribbon of ointment into the right lower eyelid twice daily for 3 days 3.5 g Radford Pax, NP  PDMP not reviewed this encounter.   Radford Pax, NP 11/25/23 [phone]

## 2023-11-25 NOTE — Discharge Instructions (Addendum)
Erythromycin ointment as prescribed.  Continue warm compresses.  Follow-up with your PCP if your symptoms do not improve.  Please go to the ER for any worsening symptoms.  I hope you feel better soon!

## 2023-12-02 ENCOUNTER — Ambulatory Visit (INDEPENDENT_AMBULATORY_CARE_PROVIDER_SITE_OTHER): Payer: 59 | Admitting: Clinical

## 2023-12-02 DIAGNOSIS — F4322 Adjustment disorder with anxiety: Secondary | ICD-10-CM

## 2023-12-02 NOTE — BH Specialist Note (Signed)
Integrated Behavioral Health Initial In-Person Visit  MRN: [id number] Name: Melanie Carlson  Last seen by previous [facility] (2023)  Number of Integrated Behavioral Health Clinician visits: 1- Initial Visit  Session Start time: 1045  Session End time: 1130  Total time in minutes: 45   Types of Service: Individual psychotherapy  Interpretor:No. Interpretor Name and Language: n/a  Subjective: Melanie Carlson is a 22 y.o. female accompanied by  pt's daughter Patient was referred by Dr. Konrad Dolores for stressors. 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  Patient and/or Family's Strengths/Protective Factors: Social and Emotional competence, Concrete supports in place (healthy food, safe environments, etc.), and Sense of purpose  Goals Addressed: Patient will: Increase knowledge and/or ability of: stress reduction  Demonstrate ability to: Increase adequate support systems for patient/family  Progress towards Goals: Ongoing  Interventions: Interventions utilized: Mindfulness or Management consultant, Psychoeducation and/or Health Education, and Link to [employer]  Standardized Assessments completed: PHQ-SADS     12/02/2023   11:37 AM 11/07/2023    8:34 AM 07/02/2022    3:48 PM  PHQ-SADS Last 3 Score only  PHQ-15  Score 2    Total GAD-7 Score 9    PHQ Adolescent Score 9 6 11     Patient and/or Family Response: Twanisha reported she's been taking the mirtazapine (Remeron) the last 2 weeks and it helps her sleep.  She's usually asleep by 11pm and wakes up around 10am.  Nelva reported she's feeling more anxious and not sure why, no specific triggers.  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  She is also taking care of her 8 year old daughter at home.  Although she has support from her grandparents that live with her, she is the primary caregiver.    Maeci reported she's trying very hard to keep her grades up so she can apply to the university that she wants to attend.  Her goal is to do psychology and communications in the future, as well as be more independent.  Tranesha was able to acknowledge that the various daily tasks and expectations can be stressful.  She was open to practicing strategies each day to help her.  Deana also asked for a referral for ongoing therapy.   Patient Centered Plan: Patient is on the following Treatment Plan(s):  Adjustment with anxious mood  Assessment: Patient currently experiencing increased stress with taking various classes through [employer] & another program, as well caring for her family.  Amore is determined and motivated to do well in her academics, being a parent, and accomplishing her goals in life.  The daily tasks and expectations may have increased her stress level, especially with the end of the school semester.   Patient may benefit from implementing one strategy or activity that can decrease her stress level.  Asimina would also  benefit from individual psychotherapy to learn & implement more coping strategies.  Plan: Follow up with behavioral health clinician on : 12/21/2023 Behavioral recommendations:  - Practice one relaxation or mindfulness strategy  each day - Try to implement one activity that you enjoy Referral(s): Community Mental Health Services (LME/Outside Clinic) - Prefers In-person, More available on Thursdays and Fridays 1pm and after. "From scale of 1-10, how likely are you to follow plan?": Nalanie agreeable to plan above  Gordy Savers, LCSW

## 2023-12-18 ENCOUNTER — Other Ambulatory Visit: Payer: Self-pay

## 2023-12-18 ENCOUNTER — Ambulatory Visit
Admission: RE | Admit: 2023-12-18 | Discharge: 2023-12-18 | Disposition: A | Discharge status: Home / Self Care | Payer: Self-pay | Source: Ambulatory Visit | Attending: Family Medicine | Admitting: Family Medicine

## 2023-12-18 VITALS — BP 118/75 | HR 97 | Temp 97.7°F | Resp 16

## 2023-12-18 DIAGNOSIS — Z7251 High risk heterosexual behavior: Secondary | ICD-10-CM | POA: Diagnosis not present

## 2023-12-18 DIAGNOSIS — Z113 Encounter for screening for infections with a predominantly sexual mode of transmission: Secondary | ICD-10-CM | POA: Insufficient documentation

## 2023-12-18 DIAGNOSIS — Z9189 Other specified personal risk factors, not elsewhere classified: Secondary | ICD-10-CM | POA: Diagnosis not present

## 2023-12-18 DIAGNOSIS — H9203 Otalgia, bilateral: Secondary | ICD-10-CM | POA: Diagnosis not present

## 2023-12-18 DIAGNOSIS — H6063 Unspecified chronic otitis externa, bilateral: Secondary | ICD-10-CM | POA: Insufficient documentation

## 2023-12-18 MED ORDER — NEOMYCIN-POLYMYXIN-HC 3.5-10000-1 OT SUSP: 3.0000 [drp] | Freq: Three times a day (TID) | OTIC | 2 refills | Status: DC

## 2023-12-18 NOTE — Discharge Instructions (Addendum)
 Use the eardrops as directed I have given you refills in case she needs them Next time you see your primary care doctor, consider requesting an ear specialty appointment

## 2023-12-18 NOTE — ED Triage Notes (Signed)
 Right ear fullness x 2-3 days. Left ear starting to feel irritated also. No fever. Also wants std testing. No otc meds.

## 2023-12-18 NOTE — ED Provider Notes (Signed)
 [facility]    CSN: [id number] Arrival date & time: 12/18/23  1217      History   Chief Complaint Chief Complaint  Patient presents with   Ear Fullness    HPI Melanie Carlson is a 23 y.o. female.   Recurring ear infections.  Going on for years.  She has seen an ENT.  Currently has ear pain right greater than left.  Decreased hearing on the right side.  No cold or runny nose symptoms.  No history of ear surgery or tubes  Also desires STD testing.  No symptoms  Past Medical History:  Diagnosis Date   Medical history non-contributory    Seasonal allergies    Seasonal allergies    Wisdom teeth extracted     Patient Active Problem List   Diagnosis Date Noted   Diastasis recti 02/17/2021   Postpartum anxiety 02/03/2021   GAD (generalized anxiety disorder) 04/24/2020   Sleep disturbance 04/24/2020   Major depressive disorder 10/01/2019   Seasonal allergies    Overweight, pediatric, BMI 85.0-94.9 percentile for age 29/18/2019    Past Surgical History:  Procedure Laterality Date   NO PAST SURGERIES      OB History     Gravida  1   Para  1   Term  1   Preterm      AB      Living  1      SAB      IAB      Ectopic      Multiple  0   Live Births  1            Home Medications    Prior to Admission medications   Medication Sig Start Date End Date Taking? Authorizing Provider  neomycin -polymyxin-hydrocortisone  (CORTISPORIN) 3.5-10000-1 OTIC suspension Place 3 drops into both ears 3 (three) times daily. 12/18/23  Yes Maranda Jamee Jacob, MD  cetirizine  (ZYRTEC  ALLERGY) 10 MG tablet Take 1 tablet (10 mg total) by mouth daily. 07/26/23   Christopher Savannah, PA-C  fluticasone  (FLONASE ) 50 MCG/ACT nasal spray Place 2 sprays into both nostrils daily. 07/26/23   Christopher Savannah, PA-C  mirtazapine  (REMERON ) 15 MG tablet Take 1 tablet (15 mg total) by mouth at bedtime. 11/07/23 11/06/24  Gretel Andes, MD    Family History Family History  Problem Relation  Age of Onset   Healthy Mother    Healthy Father     Social History Social History   Tobacco Use   Smoking status: Never    Passive exposure: Never   Smokeless tobacco: Never  Vaping Use   Vaping status: Never Used  Substance Use Topics   Alcohol use: Not Currently   Drug use: Yes    Frequency: 3.0 times per week    Types: Marijuana     Allergies   Patient has no known allergies.   Review of Systems Review of Systems  See HPI Physical Exam Triage Vital Signs ED Triage Vitals  Encounter Vitals Group     BP 12/18/23 1300 118/75     Systolic BP Percentile --      Diastolic BP Percentile --      Pulse Rate 12/18/23 1300 97     Resp 12/18/23 1300 16     Temp 12/18/23 1300 97.7 F (36.5 C)     Temp src --      SpO2 12/18/23 1300 98 %     Weight --      Height --  Head Circumference --      Peak Flow --      Pain Score 12/18/23 1304 5     Pain Loc --      Pain Education --      Exclude from Growth Chart --    No data found.  Updated Vital Signs BP 118/75   Pulse 97   Temp 97.7 F (36.5 C)   Resp 16   SpO2 98%      Physical Exam Constitutional:      General: She is not in acute distress.    Appearance: She is well-developed.  HENT:     Head: Normocephalic and atraumatic.     Ears:     Comments: Both ears have swelling of the outer ear canal with some exudate.  Partial TM visualized appears clear Eyes:     Conjunctiva/sclera: Conjunctivae normal.     Pupils: Pupils are equal, round, and reactive to light.  Cardiovascular:     Rate and Rhythm: Normal rate.  Pulmonary:     Effort: Pulmonary effort is normal. No respiratory distress.  Abdominal:     General: There is no distension.     Palpations: Abdomen is soft.  Musculoskeletal:        General: Normal range of motion.     Cervical back: Normal range of motion.  Lymphadenopathy:     Cervical: Cervical adenopathy present.  Skin:    General: Skin is warm and dry.  Neurological:      Mental Status: She is alert.      UC Treatments / Results  Labs (all labs ordered are listed, but only abnormal results are displayed) Labs Reviewed  RPR  HIV ANTIBODY (ROUTINE TESTING W REFLEX)  CERVICOVAGINAL ANCILLARY ONLY    EKG   Radiology No results found.  Procedures Procedures (including critical care time)  Medications Ordered in UC Medications - No data to display  Initial Impression / Assessment and Plan / UC Course  I have reviewed the triage vital signs and the nursing notes.  Pertinent labs & imaging results that were available during my care of the patient were reviewed by me and considered in my medical decision making (see chart for details).     Patient asked why she has recurring of the external ear infection.  I told her at 1 visit, such as an urgent care, this is difficult for me to predict.  I told her that some people do have eczema and skin conditions in their outer ear canal that can make them more prone to infection.  We discussed not getting water in the ears. Final Clinical Impressions(s) / UC Diagnoses   Final diagnoses:  Risk for sexually transmitted disease  Chronic otitis externa of both ears, unspecified type     Discharge Instructions      Use the eardrops as directed I have given you refills in case she needs them Next time you see your primary care doctor, consider requesting an ear specialty appointment   ED Prescriptions     Medication Sig Dispense Auth. Provider   neomycin -polymyxin-hydrocortisone  (CORTISPORIN) 3.5-10000-1 OTIC suspension Place 3 drops into both ears 3 (three) times daily. 10 mL Maranda Jamee Jacob, MD      PDMP not reviewed this encounter.   Maranda Jamee Jacob, MD 12/18/23 1341

## 2023-12-19 ENCOUNTER — Ambulatory Visit
Admission: RE | Admit: 2023-12-19 | Discharge: 2023-12-19 | Disposition: A | Discharge status: Home / Self Care | Payer: 59 | Source: Ambulatory Visit | Attending: Family Medicine | Admitting: Family Medicine

## 2023-12-19 VITALS — BP 113/72 | HR 82 | Temp 98.5°F | Resp 18 | Ht 63.0 in | Wt 131.0 lb

## 2023-12-19 DIAGNOSIS — H60391 Other infective otitis externa, right ear: Secondary | ICD-10-CM | POA: Diagnosis not present

## 2023-12-19 LAB — CERVICOVAGINAL ANCILLARY ONLY
Chlamydia: NEGATIVE
Comment: NEGATIVE
Comment: NEGATIVE
Comment: NORMAL
Neisseria Gonorrhea: NEGATIVE
Trichomonas: NEGATIVE

## 2023-12-19 LAB — RPR: RPR Ser Ql: NONREACTIVE

## 2023-12-19 MED ORDER — NAPROXEN 500 MG PO TABS: 500.0000 mg | ORAL_TABLET | Freq: Two times a day (BID) | ORAL | 0 refills | Status: AC

## 2023-12-19 NOTE — Discharge Instructions (Signed)
 Continue using eardrops as previously prescribed

## 2023-12-19 NOTE — ED Provider Notes (Signed)
 [facility]    CSN: [id number] Arrival date & time: 12/19/23  1342      History   Chief Complaint Chief Complaint  Patient presents with   Otalgia    HPI Melanie Carlson is a 23 y.o. female.    Otalgia Associated symptoms: ear discharge   Associated symptoms: no cough, no fever, no headaches, no rhinorrhea and no sore throat   Right ear pain, seen yesterday at another urgent care center diagnosed with otitis externa, was treated with antibiotic drops.  States she needs something for pain.  Has been using drops as prescribed.  Past Medical History:  Diagnosis Date   Medical history non-contributory    Seasonal allergies    Seasonal allergies    Wisdom teeth extracted     Patient Active Problem List   Diagnosis Date Noted   Diastasis recti 02/17/2021   Postpartum anxiety 02/03/2021   GAD (generalized anxiety disorder) 04/24/2020   Sleep disturbance 04/24/2020   Major depressive disorder 10/01/2019   Seasonal allergies    Overweight, pediatric, BMI 85.0-94.9 percentile for age 61/18/2019    Past Surgical History:  Procedure Laterality Date   NO PAST SURGERIES      OB History     Gravida  1   Para  1   Term  1   Preterm      AB      Living  1      SAB      IAB      Ectopic      Multiple  0   Live Births  1            Home Medications    Prior to Admission medications   Medication Sig Start Date End Date Taking? Authorizing Provider  naproxen  (NAPROSYN ) 500 MG tablet Take 1 tablet (500 mg total) by mouth 2 (two) times daily with a meal for 10 days. 12/19/23 12/29/23 Yes Catelyn Friel, PA  cetirizine  (ZYRTEC  ALLERGY) 10 MG tablet Take 1 tablet (10 mg total) by mouth daily. 07/26/23   Christopher Savannah, PA-C  fluticasone  (FLONASE ) 50 MCG/ACT nasal spray Place 2 sprays into both nostrils daily. 07/26/23   Christopher Savannah, PA-C  mirtazapine  (REMERON ) 15 MG tablet Take 1 tablet (15 mg total) by mouth at bedtime. 11/07/23 11/06/24  Gretel Andes, MD  neomycin -polymyxin-hydrocortisone  (CORTISPORIN) 3.5-10000-1 OTIC suspension Place 3 drops into both ears 3 (three) times daily. 12/18/23   Maranda Jamee Jacob, MD    Family History Family History  Problem Relation Age of Onset   Healthy Mother    Healthy Father     Social History Social History   Tobacco Use   Smoking status: Never    Passive exposure: Never   Smokeless tobacco: Never  Vaping Use   Vaping status: Never Used  Substance Use Topics   Alcohol use: Not Currently   Drug use: Yes    Frequency: 3.0 times per week    Types: Marijuana     Allergies   Patient has no known allergies.   Review of Systems Review of Systems  Constitutional:  Negative for appetite change, fatigue and fever.  HENT:  Positive for ear discharge and ear pain. Negative for facial swelling, rhinorrhea and sore throat.   Respiratory:  Negative for cough.   Neurological:  Negative for dizziness and headaches.     Physical Exam Triage Vital Signs ED Triage Vitals  Encounter Vitals Group     BP 12/19/23 1355 113/72  Systolic BP Percentile --      Diastolic BP Percentile --      Pulse Rate 12/19/23 1355 82     Resp 12/19/23 1355 18     Temp 12/19/23 1355 98.5 F (36.9 C)     Temp Source 12/19/23 1355 Oral     SpO2 12/19/23 1355 98 %     Weight 12/19/23 1353 131 lb (59.4 kg)     Height 12/19/23 1353 5' 3 (1.6 m)     Head Circumference --      Peak Flow --      Pain Score 12/19/23 1353 7     Pain Loc --      Pain Education --      Exclude from Growth Chart --    No data found.  Updated Vital Signs BP 113/72 (BP Location: Right Arm)   Pulse 82   Temp 98.5 F (36.9 C) (Oral)   Resp 18   Ht 5' 3 (1.6 m)   Wt 131 lb (59.4 kg)   LMP 11/15/2023 (Exact Date)   SpO2 98%   BMI 23.21 kg/m   Visual Acuity Right Eye Distance:   Left Eye Distance:   Bilateral Distance:    Right Eye Near:   Left Eye Near:    Bilateral Near:     Physical Exam Vitals and  nursing note reviewed.  Constitutional:      Appearance: She is not ill-appearing.  HENT:     Head: Normocephalic and atraumatic.     Ears:     Comments: Right canal is tender, has exudate, no swelling, close portion of TM is normal Musculoskeletal:     Cervical back: Neck supple.  Lymphadenopathy:     Cervical: No cervical adenopathy.  Skin:    General: Skin is warm and dry.  Neurological:     Mental Status: She is alert and oriented to person, place, and time.  Psychiatric:        Mood and Affect: Mood normal.      UC Treatments / Results  Labs (all labs ordered are listed, but only abnormal results are displayed) Labs Reviewed - No data to display  EKG   Radiology No results found.  Procedures Procedures (including critical care time)  Medications Ordered in UC Medications - No data to display  Initial Impression / Assessment and Plan / UC Course  I have reviewed the triage vital signs and the nursing notes.  Pertinent labs & imaging results that were available during my care of the patient were reviewed by me and considered in my medical decision making (see chart for details).     23 year old female with otitis externa currently on antibiotics requesting something for pain.  Right canal is tender with exudate but no swelling, visualized portion of TM is normal, no adenopathy.  Rx sent to pharmacy patient counseled to continue eardrops as previously prescribed Final Clinical Impressions(s) / UC Diagnoses   Final diagnoses:  Infective otitis externa of right ear     Discharge Instructions      Continue using eardrops as previously prescribed   ED Prescriptions     Medication Sig Dispense Auth. Provider   naproxen  (NAPROSYN ) 500 MG tablet Take 1 tablet (500 mg total) by mouth 2 (two) times daily with a meal for 10 days. 20 tablet Shatiqua Heroux, [place]      PDMP not reviewed this encounter.   Adedamola Seto, [place] 12/19/23 [phone]

## 2023-12-19 NOTE — ED Triage Notes (Signed)
 Pt presents with right ear pain x 4 days. Pt was seen yesterday at urgent care in [place] and prescribed antibiotics to take. Pt reports she has taken two doses of the antibiotic prescribed with no improvement in ear pain. Pt states my ear is throbbing. They did not give me anything for pain. Pt currently rates her right ear pain as a 7/10. OTC Tylenol  is the only other medication taken.

## 2023-12-20 LAB — HIV ANTIBODY (ROUTINE TESTING W REFLEX): HIV Screen 4th Generation wRfx: NONREACTIVE

## 2023-12-21 ENCOUNTER — Ambulatory Visit (INDEPENDENT_AMBULATORY_CARE_PROVIDER_SITE_OTHER): Payer: 59 | Admitting: Clinical

## 2023-12-21 DIAGNOSIS — F4322 Adjustment disorder with anxiety: Secondary | ICD-10-CM

## 2023-12-21 NOTE — BH Specialist Note (Signed)
 Integrated Behavioral Health Follow Up In-Person Visit  MRN: [id number] Name: Melanie Carlson  Number of Integrated Behavioral Health Clinician visits: 2- Second Visit  Session Start time: 1107  Session End time: 1200  Total time in minutes: 53  Types of Service: Individual psychotherapy  Interpretor:No. Interpretor Name and Language: n/a  Subjective: Melanie Carlson is a 23 y.o. female who came by herself to the visit. Patient was referred by Dr. Gretel for increased anxiety & stress . Patient reports the following symptoms/concerns:  - decrease in stress level Duration of problem: weeks; Severity of problem: mild  Objective: Mood: Euthymic and Affect: Appropriate Risk of harm to self or others: No plan to harm self or others   Life Context: Chart review Family and Social: Lives with maternal grandparents, daughter and younger sister. School/Work: [employer] at this time, working on going to [employer] or [employer] for [employer] Self-Care: Enjoys doing make up/hair, Writing, Going out with friends when she can Life Changes: Taking classes at [employer], doing the sterile processing program/class, continuing to parent an almost 33 yo daughter.  Patient and/or Family's Strengths/Protective Factors: Social and Emotional competence, Concrete supports in place (healthy food, safe environments, etc.), and Sense of purpose  Goals Addressed: Patient will: Increase knowledge and/or ability of: stress reduction  Demonstrate ability to: Increase adequate support systems for patient/family  Progress towards Goals: Ongoing  Interventions: Interventions utilized:  Supportive Counseling, Psychoeducation and/or Health Education, Link to [employer], and Identified strengths & accomplishments Standardized Assessments completed: Not Needed  Patient and/or Family Response:  Melanie Carlson reported her stress level has decreased since the last visit. - [name]'s daughter  started daycare this week and her daughter enjoys it. It also gives Melanie Carlson more time to accomplish her tasks during the day. - Classes at [employer] will start next week so she has a little more time to work on other things she needs to do.  - Clinicals will start next month which may help her get a job that she wants. 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  Eddy shared having a strong support system with her maternal grandparents & younger sister. At the same time, she is hoping to obtain additional support from other people in her life.  Melanie Carlson shared about other relationships in her life and how it's affected her.  She was able to share her thoughts & feelings about some of the difficult times that she went through as well as current challenges she's having.  Melanie Carlson reported that the agency she was referred to didn't accept her insurance and she would like to be referred to another counseling agency.   Patient Centered Plan: Patient is on the following Treatment Plan(s): Adjustment with anxious mood  Assessment: Patient currently experiencing decreased stress and anxiety level due to time off from classes and additional changes in her life.  [name] is utilizing her strengths and current support system in order to help with managing her stress and accomplishing her goals.  [name] is open to exploring how her past experiences have affected her and would benefit from ongoing psycho therapy.   Patient may benefit from individual therapy to process previous experiences and learning more coping strategies to implement.  Plan: Follow up with behavioral health clinician on : 01/18/2024 Behavioral recommendations:  - Continue to utilize current support system - Continue to work on current goals and implement pleasant activities in the next few weeks Referral(s):  Community [facility] Services (LME/Outside Clinic)Referral to [facility],  other agency doesn't accept her insurance From scale of 1-10, how likely are you to follow plan?: Melanie Carlson agreeable to plan above  Rolin SHAUNNA Pouch, LCSW

## 2024-01-09 ENCOUNTER — Ambulatory Visit
Admission: RE | Admit: 2024-01-09 | Discharge: 2024-01-09 | Disposition: A | Discharge status: Home / Self Care | Payer: Medicaid Other | Source: Ambulatory Visit | Attending: Family Medicine | Admitting: Family Medicine

## 2024-01-09 VITALS — BP 118/80 | HR 97 | Temp 99.6°F | Resp 16

## 2024-01-09 DIAGNOSIS — F411 Generalized anxiety disorder: Secondary | ICD-10-CM | POA: Diagnosis not present

## 2024-01-09 DIAGNOSIS — B9689 Other specified bacterial agents as the cause of diseases classified elsewhere: Secondary | ICD-10-CM

## 2024-01-09 DIAGNOSIS — J069 Acute upper respiratory infection, unspecified: Secondary | ICD-10-CM

## 2024-01-09 MED ORDER — PSEUDOEPHEDRINE HCL 60 MG PO TABS: 60.0000 mg | ORAL_TABLET | Freq: Three times a day (TID) | ORAL | 0 refills | Status: AC | PRN

## 2024-01-09 MED ORDER — CETIRIZINE HCL 10 MG PO TABS: 10.0000 mg | ORAL_TABLET | Freq: Every day | ORAL | 0 refills | Status: AC

## 2024-01-09 MED ORDER — PROMETHAZINE-DM 6.25-15 MG/5ML PO SYRP: 5.0000 mL | ORAL_SOLUTION | Freq: Three times a day (TID) | ORAL | 0 refills | Status: AC | PRN

## 2024-01-09 MED ORDER — AMOXICILLIN 875 MG PO TABS: 875.0000 mg | ORAL_TABLET | Freq: Two times a day (BID) | ORAL | 0 refills | Status: AC

## 2024-01-09 NOTE — ED Triage Notes (Signed)
Pt reports sore throat and cough x 1 week. Sore throat worse after coughing. Daughter had a cold. Otc meds gives some relief.

## 2024-01-09 NOTE — Discharge Instructions (Signed)
We will manage this as a bacterial upper respiratory infection with amoxicillin. For sore throat or cough try using a honey-based tea. Use 3 teaspoons of honey with juice squeezed from half lemon. Place shaved pieces of ginger into 1/2-1 cup of water and warm over stove top. Then mix the ingredients and repeat every 4 hours as needed. Please take ibuprofen 600mg  every 6 hours with food alternating with OR taken together with Tylenol 500mg -650mg  every 6 hours for throat pain, fevers, aches and pains. Hydrate very well with at least 2 liters of water. Eat light meals such as soups (chicken and noodles, vegetable, chicken and wild rice).  Do not eat foods that you are allergic to.  Taking an antihistamine like Zyrtec can help against postnasal drainage, sinus congestion which can cause sinus pain, sinus headaches, throat pain, painful swallowing, coughing.  You can take this together with pseudoephedrine (Sudafed) at a dose of 60 mg 3 times a day or twice daily as needed for the same kind of nasal drip, congestion.  Use cough medication as needed.

## 2024-01-09 NOTE — ED Provider Notes (Signed)
[facility]  Note:  This document was prepared using Conservation officer, historic buildings and may include unintentional dictation errors.  MRN: [id number] DOB: 08-Nov-2001  Subjective:   Melanie Carlson is a 23 y.o. female presenting for 1+ week history of persistent throat pain, hoarseness, drainage, coughing that elicits chest pain.  No shortness of breath or wheezing.  Has had 1 sick contact with her daughter who is not better.  Patient has been using multiple over-the-counter medications without relief.  Takes mirtazapine.    No Known Allergies  Past Medical History:  Diagnosis Date   Medical history non-contributory    Seasonal allergies    Seasonal allergies    Wisdom teeth extracted      Past Surgical History:  Procedure Laterality Date   NO PAST SURGERIES      Family History  Problem Relation Age of Onset   Healthy Mother    Healthy Father     Social History   Tobacco Use   Smoking status: Never    Passive exposure: Never   Smokeless tobacco: Never  Vaping Use   Vaping status: Never Used  Substance Use Topics   Alcohol use: Not Currently   Drug use: Yes    Frequency: 3.0 times per week    Types: Marijuana    ROS   Objective:   Vitals: BP 118/80 (BP Location: Right Arm)   Pulse 97   Temp 99.6 F (37.6 C) (Oral)   Resp 16   LMP 12/16/2023 (Exact Date)   SpO2 98%   Physical Exam Constitutional:      General: She is not in acute distress.    Appearance: Normal appearance. She is well-developed and normal weight. She is not ill-appearing, toxic-appearing or diaphoretic.  HENT:     Head: Normocephalic and atraumatic.     Right Ear: Tympanic membrane, ear canal and external ear normal. No drainage or tenderness. No middle ear effusion. There is no impacted cerumen. Tympanic membrane is not erythematous or bulging.     Left Ear: Tympanic membrane, ear canal and external ear normal. No drainage or tenderness.  No middle ear  effusion. There is no impacted cerumen. Tympanic membrane is not erythematous or bulging.     Nose: Congestion and rhinorrhea present.     Mouth/Throat:     Mouth: Mucous membranes are moist. No oral lesions.     Pharynx: Posterior oropharyngeal erythema present. No pharyngeal swelling, oropharyngeal exudate or uvula swelling.     Tonsils: No tonsillar exudate or tonsillar abscesses.  Eyes:     General: No scleral icterus.       Right eye: No discharge.        Left eye: No discharge.     Extraocular Movements: Extraocular movements intact.     Right eye: Normal extraocular motion.     Left eye: Normal extraocular motion.     Conjunctiva/sclera: Conjunctivae normal.  Cardiovascular:     Rate and Rhythm: Normal rate and regular rhythm.     Heart sounds: Normal heart sounds. No murmur heard.    No friction rub. No gallop.  Pulmonary:     Effort: Pulmonary effort is normal. No respiratory distress.     Breath sounds: No stridor. No wheezing, rhonchi or rales.  Chest:     Chest wall: No tenderness.  Musculoskeletal:     Cervical back: Normal range of motion and neck supple.  Lymphadenopathy:     Cervical: No cervical adenopathy.  Skin:  General: Skin is warm and dry.  Neurological:     General: No focal deficit present.     Mental Status: She is alert and oriented to person, place, and time.  Psychiatric:        Mood and Affect: Mood normal.        Behavior: Behavior normal.     Assessment and Plan :   PDMP not reviewed this encounter.  1. Bacterial upper respiratory infection    Will manage for bacterial upper respiratory infection given progression of illness.  Recommend amoxicillin, supportive care.  Deferred imaging given clear cardiopulmonary exam, hemodynamically stable vital signs.  Counseled patient on potential for adverse effects with medications prescribed/recommended today, ER and return-to-clinic precautions discussed, patient verbalized understanding.     Wallis Bamberg, PA-C 01/09/24 1428

## 2024-01-16 DIAGNOSIS — F411 Generalized anxiety disorder: Secondary | ICD-10-CM | POA: Diagnosis not present

## 2024-01-18 ENCOUNTER — Ambulatory Visit: Payer: 59 | Admitting: Clinical

## 2024-02-13 DIAGNOSIS — F411 Generalized anxiety disorder: Secondary | ICD-10-CM | POA: Diagnosis not present

## 2024-02-27 DIAGNOSIS — F411 Generalized anxiety disorder: Secondary | ICD-10-CM | POA: Diagnosis not present

## 2024-03-12 DIAGNOSIS — F411 Generalized anxiety disorder: Secondary | ICD-10-CM | POA: Diagnosis not present

## 2024-03-26 DIAGNOSIS — F411 Generalized anxiety disorder: Secondary | ICD-10-CM | POA: Diagnosis not present

## 2024-04-09 ENCOUNTER — Ambulatory Visit
Admission: RE | Admit: 2024-04-09 | Discharge: 2024-04-09 | Disposition: A | Discharge status: Home / Self Care | Source: Ambulatory Visit | Attending: Physician Assistant | Admitting: Physician Assistant

## 2024-04-09 ENCOUNTER — Other Ambulatory Visit: Payer: Self-pay

## 2024-04-09 VITALS — BP 117/77 | HR 70 | Temp 98.0°F | Resp 16 | Ht 63.0 in | Wt 140.0 lb

## 2024-04-09 DIAGNOSIS — H60393 Other infective otitis externa, bilateral: Secondary | ICD-10-CM

## 2024-04-09 MED ORDER — HYDROCORTISONE-ACETIC ACID 1-2 % OT SOLN: 5.0000 [drp] | Freq: Three times a day (TID) | OTIC | 0 refills | Status: AC

## 2024-04-09 NOTE — Discharge Instructions (Addendum)
 You are seen today for concerns of ear discomfort in both ears.  At this time it appears that you have an infection of the skin of the canal of your ears.  This is best treated with eardrops several times per day until the infection clears.  I have sent in acetic acid-hydrocortisone eardrops for you to use in each ear about 3 times per day for the next 7 days.  You can use Tylenol  and ibuprofen  as needed for pain management. If at any point you start to develop severe pain, fever, chills, difficulty hearing please return to urgent care or your primary care office for follow-up. Please refrain from getting water into the ear canal.  You can use a cotton ball on the outside of the ear to block water while in the shower and please do not submerge your head until treatment is over.

## 2024-04-09 NOTE — ED Provider Notes (Signed)
 [facility]    CSN: [id number] Arrival date & time: 04/09/24  1115      History   Chief Complaint Chief Complaint  Patient presents with   Ear Fullness    Both of my ears feel tender and sore, with a constant ringing sound. My hearing is significantly reduced, and everything sounds muffled. - Entered by patient    HPI Melanie Carlson is a 23 y.o. female.   HPI  She reports having ear fullness and hearing changes for about 2 days She denies discharge, swelling, chills, fevers  She reports both ears are affected   Interventions: None at home    Past Medical History:  Diagnosis Date   Medical history non-contributory    Seasonal allergies    Seasonal allergies    Wisdom teeth extracted     Patient Active Problem List   Diagnosis Date Noted   Diastasis recti 02/17/2021   Postpartum anxiety 02/03/2021   GAD (generalized anxiety disorder) 04/24/2020   Sleep disturbance 04/24/2020   Major depressive disorder 10/01/2019   Seasonal allergies    Overweight, pediatric, BMI 85.0-94.9 percentile for age 12/30/2017    Past Surgical History:  Procedure Laterality Date   NO PAST SURGERIES      OB History     Gravida  1   Para  1   Term  1   Preterm      AB      Living  1      SAB      IAB      Ectopic      Multiple  0   Live Births  1            Home Medications    Prior to Admission medications   Medication Sig Start Date End Date Taking? Authorizing Provider  acetic acid-hydrocortisone (VOSOL-HC) OTIC solution Place 5 drops into both ears 3 (three) times daily for 7 days. 04/09/24 04/16/24 Yes Jayceion Lisenby E, PA-C  amoxicillin  (AMOXIL ) 875 MG tablet Take 1 tablet (875 mg total) by mouth 2 (two) times daily. 01/09/24   Adolph Hoop, PA-C  cetirizine  (ZYRTEC  ALLERGY) 10 MG tablet Take 1 tablet (10 mg total) by mouth daily. 01/09/24   Adolph Hoop, PA-C  mirtazapine  (REMERON ) 15 MG tablet Take 1 tablet (15 mg total) by mouth at bedtime.  11/07/23 11/06/24  Canda Cera, MD  Phenylephrine-DM-GG-APAP (COLD & FLU PO) Take by mouth.    [provider]  promethazine -dextromethorphan (PROMETHAZINE -DM) 6.25-15 MG/5ML syrup Take 5 mLs by mouth 3 (three) times daily as needed for cough. 01/09/24   Adolph Hoop, PA-C  pseudoephedrine  (SUDAFED) 60 MG tablet Take 1 tablet (60 mg total) by mouth every 8 (eight) hours as needed for congestion. 01/09/24   Adolph Hoop, PA-C    Family History Family History  Problem Relation Age of Onset   Healthy Mother    Healthy Father     Social History Social History   Tobacco Use   Smoking status: Never    Passive exposure: Never   Smokeless tobacco: Never  Vaping Use   Vaping status: Never Used  Substance Use Topics   Alcohol use: Not Currently   Drug use: Yes    Frequency: 3.0 times per week    Types: Marijuana     Allergies   Patient has no known allergies.   Review of Systems Review of Systems  Constitutional:  Negative for chills and fever.  HENT:  Positive for ear pain (  with touching the area) and hearing loss. Negative for congestion, ear discharge and sore throat.      Physical Exam Triage Vital Signs ED Triage Vitals  Encounter Vitals Group     BP 04/09/24 1202 117/77     Systolic BP Percentile --      Diastolic BP Percentile --      Pulse Rate 04/09/24 1202 70     Resp 04/09/24 1202 16     Temp 04/09/24 1202 98 F (36.7 C)     Temp Source 04/09/24 1202 Oral     SpO2 04/09/24 1202 98 %     Weight 04/09/24 1202 140 lb (63.5 kg)     Height 04/09/24 1202 5\' 3"  (1.6 m)     Head Circumference --      Peak Flow --      Pain Score 04/09/24 1216 2     Pain Loc --      Pain Education --      Exclude from Growth Chart --    No data found.  Updated Vital Signs BP 117/77 (BP Location: Right Arm)   Pulse 70   Temp 98 F (36.7 C) (Oral)   Resp 16   Ht 5\' 3"  (1.6 m)   Wt 140 lb (63.5 kg)   LMP 03/13/2024 (Exact Date)   SpO2 98%   BMI 24.80 kg/m    Visual Acuity Right Eye Distance:   Left Eye Distance:   Bilateral Distance:    Right Eye Near:   Left Eye Near:    Bilateral Near:     Physical Exam Vitals reviewed.  Constitutional:      General: She is awake.     Appearance: Normal appearance. She is well-developed and well-groomed.  HENT:     Head: Normocephalic and atraumatic.     Right Ear: Hearing and tympanic membrane normal. Drainage and swelling present.     Left Ear: Hearing and tympanic membrane normal. Drainage and swelling present.     Ears:     Comments: Canals appear macerated and have some swelling and drainage Neurological:     Mental Status: She is alert.  Psychiatric:        Behavior: Behavior is cooperative.      UC Treatments / Results  Labs (all labs ordered are listed, but only abnormal results are displayed) Labs Reviewed - No data to display  EKG   Radiology No results found.  Procedures Procedures (including critical care time)  Medications Ordered in UC Medications - No data to display  Initial Impression / Assessment and Plan / UC Course  I have reviewed the triage vital signs and the nursing notes.  Pertinent labs & imaging results that were available during my care of the patient were reviewed by me and considered in my medical decision making (see chart for details).      Final Clinical Impressions(s) / UC Diagnoses   Final diagnoses:  Infective otitis externa of both ears   Patient presents today with concerns for bilateral ear discomfort this been ongoing for about 2 days.  She reports that she has also had ear fullness and hearing changes.  Physical exam is notable for macerated appearance of bilateral ear canals.  Tympanic membranes appear to be intact and normal to appearance bilaterally.  Given intact tympanic membranes will send in prescription for acetic acid-hydrocortisone otic solution to be used in both ears 3 times per day for the next 7 days.  Reviewed  preventing  contamination with water or other objects while treating.  ED return precautions reviewed and provided after visit summary.  Follow-up as needed.    Discharge Instructions      You are seen today for concerns of ear discomfort in both ears.  At this time it appears that you have an infection of the skin of the canal of your ears.  This is best treated with eardrops several times per day until the infection clears.  I have sent in acetic acid-hydrocortisone eardrops for you to use in each ear about 3 times per day for the next 7 days.  You can use Tylenol  and ibuprofen  as needed for pain management. If at any point you start to develop severe pain, fever, chills, difficulty hearing please return to urgent care or your primary care office for follow-up. Please refrain from getting water into the ear canal.  You can use a cotton ball on the outside of the ear to block water while in the shower and please do not submerge your head until treatment is over.     ED Prescriptions     Medication Sig Dispense Auth. Provider   acetic acid-hydrocortisone (VOSOL-HC) OTIC solution Place 5 drops into both ears 3 (three) times daily for 7 days. 10 mL Ovie Eastep E, PA-C      PDMP not reviewed this encounter.   Jerona Mooring, PA-C 04/09/24 1258

## 2024-04-09 NOTE — ED Triage Notes (Addendum)
 Pt presents with complaints of bilateral ear fullness/pain x 2 days. Hearing is decreased & there is intermittent ringing in ears. Pt currently rates her overall pain a 2/10. Denies taking or applying medications PTA. Denies fevers at home.

## 2024-05-21 DIAGNOSIS — F411 Generalized anxiety disorder: Secondary | ICD-10-CM | POA: Diagnosis not present

## 2024-07-13 DEATH — deceased
